# Patient Record
Sex: Male | Born: 1985 | Race: Black or African American | Hispanic: No | Marital: Single | Smoking: Current every day smoker
Health system: Southern US, Community
[De-identification: ages and names within clinical notes are randomized; demographics above are authoritative.]

---

## 2007-10-07 ENCOUNTER — Emergency Department (HOSPITAL_COMMUNITY): Admission: EM | Admit: 2007-10-07 | Discharge: 2007-10-07 | Payer: Self-pay | Admitting: Family Medicine

## 2009-02-25 ENCOUNTER — Emergency Department (HOSPITAL_COMMUNITY): Admission: EM | Admit: 2009-02-25 | Discharge: 2009-02-25 | Payer: Self-pay | Admitting: Emergency Medicine

## 2009-03-11 ENCOUNTER — Emergency Department (HOSPITAL_COMMUNITY): Admission: EM | Admit: 2009-03-11 | Discharge: 2009-03-11 | Payer: Self-pay | Admitting: Emergency Medicine

## 2011-01-02 LAB — GC/CHLAMYDIA PROBE AMP, GENITAL
Chlamydia, DNA Probe: POSITIVE — AB
GC Probe Amp, Genital: NEGATIVE

## 2017-02-07 ENCOUNTER — Encounter (HOSPITAL_COMMUNITY): Payer: Self-pay | Admitting: Emergency Medicine

## 2017-02-07 ENCOUNTER — Emergency Department (HOSPITAL_COMMUNITY)
Admission: EM | Admit: 2017-02-07 | Discharge: 2017-02-07 | Disposition: A | Discharge status: Home / Self Care | Payer: BLUE CROSS/BLUE SHIELD | Attending: Emergency Medicine | Admitting: Emergency Medicine

## 2017-02-07 ENCOUNTER — Emergency Department (HOSPITAL_COMMUNITY): Payer: BLUE CROSS/BLUE SHIELD

## 2017-02-07 DIAGNOSIS — S0181XA Laceration without foreign body of other part of head, initial encounter: Secondary | ICD-10-CM | POA: Diagnosis not present

## 2017-02-07 DIAGNOSIS — Y939 Activity, unspecified: Secondary | ICD-10-CM | POA: Insufficient documentation

## 2017-02-07 DIAGNOSIS — R400 Somnolence: Secondary | ICD-10-CM | POA: Diagnosis not present

## 2017-02-07 DIAGNOSIS — Y929 Unspecified place or not applicable: Secondary | ICD-10-CM | POA: Insufficient documentation

## 2017-02-07 DIAGNOSIS — W268XXA Contact with other sharp object(s), not elsewhere classified, initial encounter: Secondary | ICD-10-CM | POA: Diagnosis not present

## 2017-02-07 DIAGNOSIS — Y999 Unspecified external cause status: Secondary | ICD-10-CM | POA: Insufficient documentation

## 2017-02-07 DIAGNOSIS — F17200 Nicotine dependence, unspecified, uncomplicated: Secondary | ICD-10-CM | POA: Diagnosis not present

## 2017-02-07 DIAGNOSIS — S0993XA Unspecified injury of face, initial encounter: Secondary | ICD-10-CM | POA: Diagnosis present

## 2017-02-07 MED ORDER — LIDOCAINE-EPINEPHRINE 2 %-1:100000 IJ SOLN
20.0000 mL | Freq: Once | INTRAMUSCULAR | Status: AC
  Administered 2017-02-07: 20 mL
  Filled 2017-02-07: qty 20

## 2017-02-07 MED ORDER — IBUPROFEN 800 MG PO TABS: 800.0000 mg | ORAL_TABLET | Freq: Three times a day (TID) | ORAL | 0 refills | Status: AC
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
## 2017-02-07 NOTE — ED Provider Notes (Signed)
LACERATION REPAIR Performed by: Garlon HatchetSANDERS, Yazmyn Valbuena M Authorized by: Garlon HatchetSANDERS, Keniesha Adderly M Consent: Verbal consent obtained. Risks and benefits: risks, benefits and alternatives were discussed Consent given by: patient Patient identity confirmed: provided demographic data Prepped and Draped in normal sterile fashion Wound explored  Laceration Location: left eyebrow  Laceration Length: 3cm  No Foreign Bodies seen or palpated  Anesthesia: local infiltration  Local anesthetic: lidocaine 2% with epinephrine  Anesthetic total: 5 ml  Irrigation method: syringe Amount of cleaning: standard  Skin closure: 5-0 prolene  Number of sutures: 6  Technique: simple interrupted  Patient tolerance: Patient tolerated the procedure well with no immediate complications.    Garlon HatchetSanders, Branden Vine M, PA-C 02/07/17 0543    Gilda CreasePollina, Christopher J, MD 02/07/17 [phone]

## 2017-02-07 NOTE — Discharge Instructions (Signed)
[facility] to have [facility] stitches removed.
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
